# Patient Record
Sex: Female | Born: 1955 | Race: White | Hispanic: No | Marital: Married
Health system: Southern US, Community
[De-identification: ages and names within clinical notes are randomized; demographics above are authoritative.]

## PROBLEM LIST (undated history)

## (undated) DIAGNOSIS — Z8673 Personal history of transient ischemic attack (TIA), and cerebral infarction without residual deficits: Secondary | ICD-10-CM

## (undated) DIAGNOSIS — I10 Essential (primary) hypertension: Secondary | ICD-10-CM

## (undated) DIAGNOSIS — E7800 Pure hypercholesterolemia, unspecified: Secondary | ICD-10-CM

## (undated) DIAGNOSIS — E119 Type 2 diabetes mellitus without complications: Secondary | ICD-10-CM

## (undated) DIAGNOSIS — I2510 Atherosclerotic heart disease of native coronary artery without angina pectoris: Secondary | ICD-10-CM

## (undated) DIAGNOSIS — I471 Supraventricular tachycardia, unspecified: Secondary | ICD-10-CM

## (undated) DIAGNOSIS — N189 Chronic kidney disease, unspecified: Secondary | ICD-10-CM

## (undated) DIAGNOSIS — N186 End stage renal disease: Secondary | ICD-10-CM

## (undated) DIAGNOSIS — Z9889 Other specified postprocedural states: Secondary | ICD-10-CM

## (undated) HISTORY — DX: Personal history of transient ischemic attack (TIA), and cerebral infarction without residual deficits: Z86.73

## (undated) HISTORY — DX: Chronic kidney disease, unspecified: N18.9

## (undated) HISTORY — DX: Atherosclerotic heart disease of native coronary artery without angina pectoris: I25.10

## (undated) HISTORY — DX: Essential (primary) hypertension: I10

## (undated) HISTORY — DX: Supraventricular tachycardia, unspecified: I47.10

## (undated) HISTORY — PX: ABDOMINAL HYSTERECTOMY: SHX81

## (undated) HISTORY — DX: Pure hypercholesterolemia, unspecified: E78.00

## (undated) HISTORY — DX: Other specified postprocedural states: Z98.890

## (undated) HISTORY — DX: Dependence on renal dialysis: N18.6

## (undated) HISTORY — DX: Type 2 diabetes mellitus without complications: E11.9

---

## 2007-06-08 ENCOUNTER — Ambulatory Visit: Payer: Self-pay | Admitting: Family Medicine

## 2007-09-03 ENCOUNTER — Ambulatory Visit: Payer: Self-pay | Admitting: Gastroenterology

## 2007-09-22 ENCOUNTER — Other Ambulatory Visit: Payer: Self-pay

## 2007-09-22 ENCOUNTER — Ambulatory Visit: Payer: Self-pay | Admitting: General Surgery

## 2007-09-25 ENCOUNTER — Ambulatory Visit: Payer: Self-pay | Admitting: General Surgery

## 2007-10-13 ENCOUNTER — Ambulatory Visit: Payer: Self-pay | Admitting: Gastroenterology

## 2008-07-04 ENCOUNTER — Emergency Department (HOSPITAL_COMMUNITY): Admission: EM | Admit: 2008-07-04 | Discharge: 2008-07-04 | Payer: Self-pay | Admitting: Emergency Medicine

## 2009-01-11 ENCOUNTER — Ambulatory Visit: Payer: Self-pay | Admitting: Family Medicine

## 2011-07-15 LAB — URINE CULTURE: Colony Count: >=100000

## 2011-07-15 LAB — DIFFERENTIAL
Basophils Absolute: 0.1
Eosinophils Absolute: 0.1
Eosinophils Relative: 1
Lymphocytes Relative: 17
Lymphs Abs: 2.2
Neutrophils Relative %: 76

## 2011-07-15 LAB — URINALYSIS, ROUTINE W REFLEX MICROSCOPIC
Ketones, ur: 15 — AB
Nitrite: NEGATIVE
Protein, ur: >300 — AB
Urobilinogen, UA: 0.2

## 2011-07-15 LAB — POCT I-STAT, CHEM 8
BUN: 13
Creatinine, Ser: 1.1
Hemoglobin: 12.9
Potassium: 4
Sodium: 136
TCO2: 30

## 2011-07-15 LAB — CBC
Platelets: 326
RDW: 13.3
WBC: 12.5 — ABNORMAL HIGH

## 2014-12-04 ENCOUNTER — Ambulatory Visit: Payer: Self-pay | Admitting: Emergency Medicine

## 2014-12-23 ENCOUNTER — Ambulatory Visit: Payer: Self-pay | Admitting: Orthopedic Surgery

## 2017-01-14 IMAGING — MR MRI CERVICAL SPINE WITHOUT CONTRAST
5 series · 43 of 48 positions shown · non-contrast
Comparison: Lumbar spine radiographs 12/04/2014.

CLINICAL DATA: Severe weakness and numbness in the right upper
extremity. Neck pain extending into the right upper extremity in
hand. The patient was pulled to the ground by a goat while walking
and on a leash 11/30/2014.

EXAM:
MRI CERVICAL SPINE WITHOUT CONTRAST
TECHNIQUE: Multiplanar, multisequence MR imaging of the cervical spine was
performed. No intravenous contrast was administered.

[Series 3: T2 · sagittal · 3.0mm · 0.70mm/px · 8 of 15 slices shown (1 of 2)]
[im 1/15]
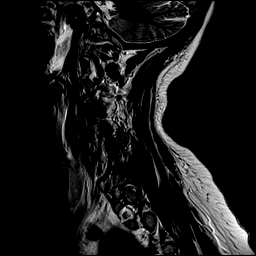
[im 3/15]
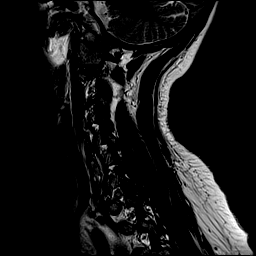
[im 5/15]
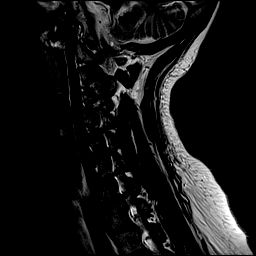
[im 7/15]
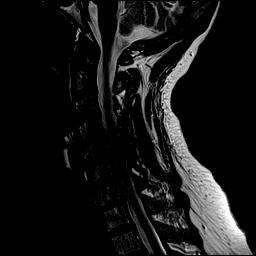
[im 9/15]
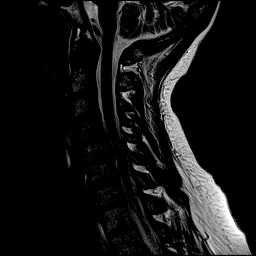
[im 11/15]
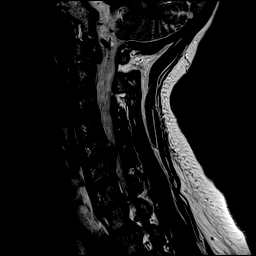
[im 13/15]
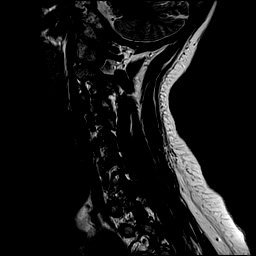
[im 15/15]
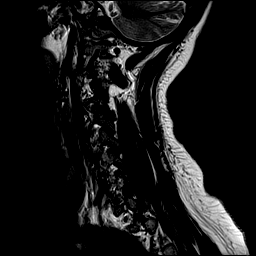

[Series 4: T1 · sagittal · 3.0mm · 0.70mm/px · 7 of 15 slices shown]
[im 1/15]
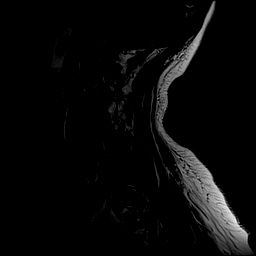
[im 3/15]
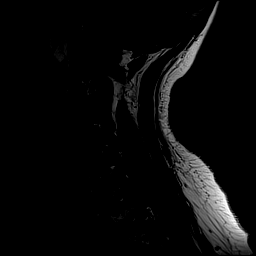
[im 5/15]
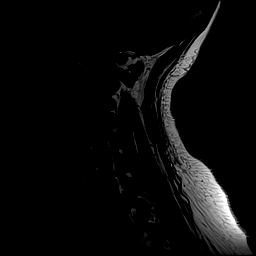
[im 8/15]
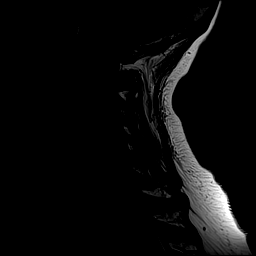
[im 10/15]
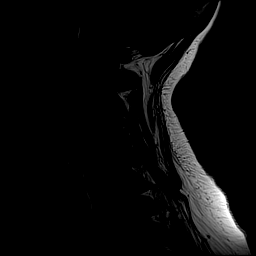
[im 12/15]
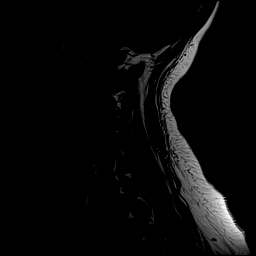
[im 15/15]
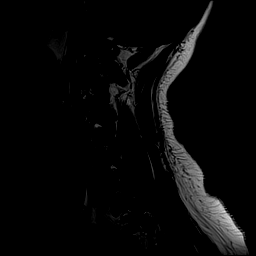

[Series 5: STIR · sagittal · 3.0mm · 0.70mm/px · 7 of 15 slices shown]
[im 1/15]
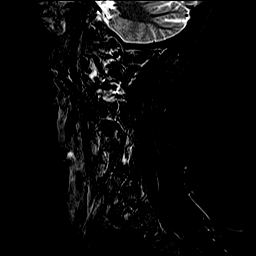
[im 3/15]
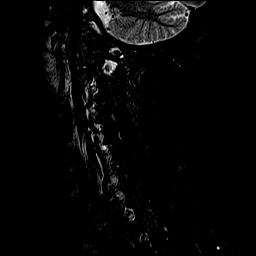
[im 5/15]
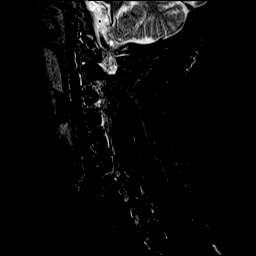
[im 8/15]
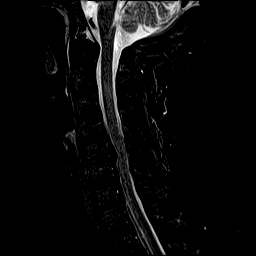
[im 10/15]
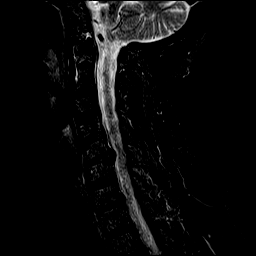
[im 12/15]
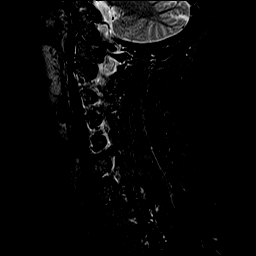
[im 15/15]
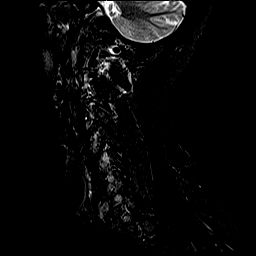

[Series 6: T2 · axial · 3.0mm · 0.70mm/px · z∈[-75,+27]mm · 13 of 28 slices shown (2 of 2)]
[im 1/28]
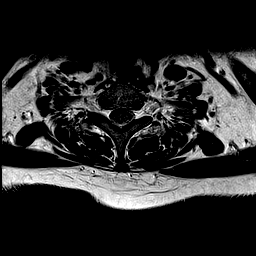
[im 3/28]
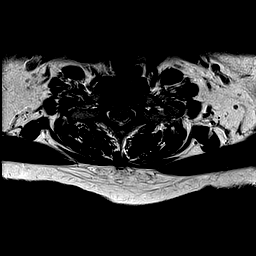
[im 5/28]
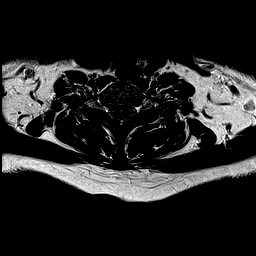
[im 7/28]
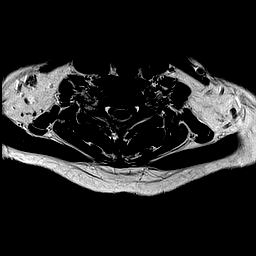
[im 10/28]
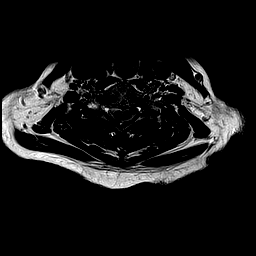
[im 12/28]
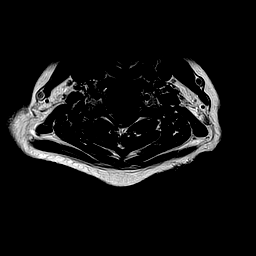
[im 14/28]
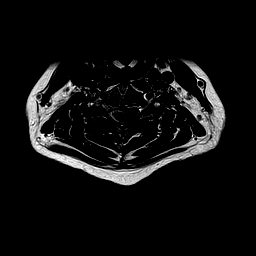
[im 16/28]
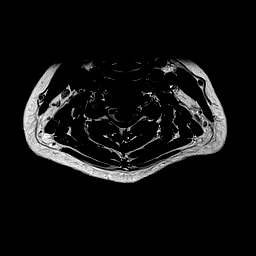
[im 19/28]
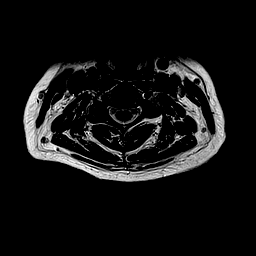
[im 21/28]
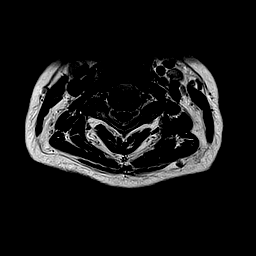
[im 23/28]
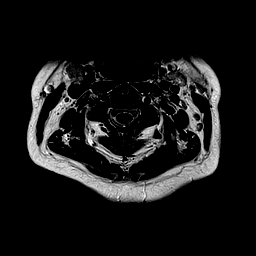
[im 25/28]
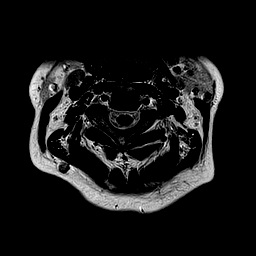
[im 28/28]
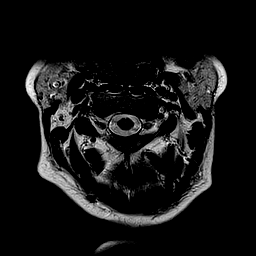

[Series 7: mpgr ax · axial · 3.0mm · 0.35mm/px · z∈[-75,+27]mm · 8 of 28 slices shown]
[im 1/28]
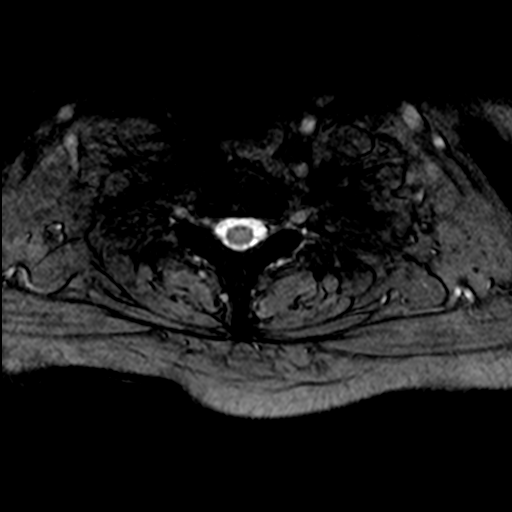
[im 5/28]
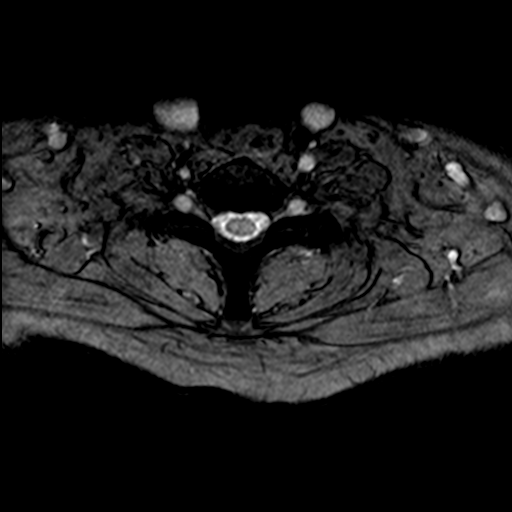
[im 10/28]
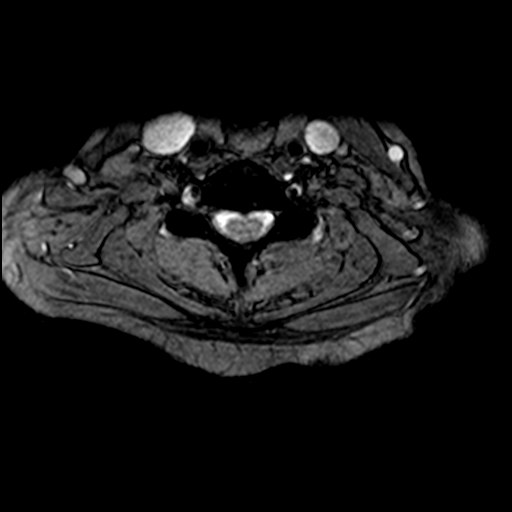
[im 12/28]
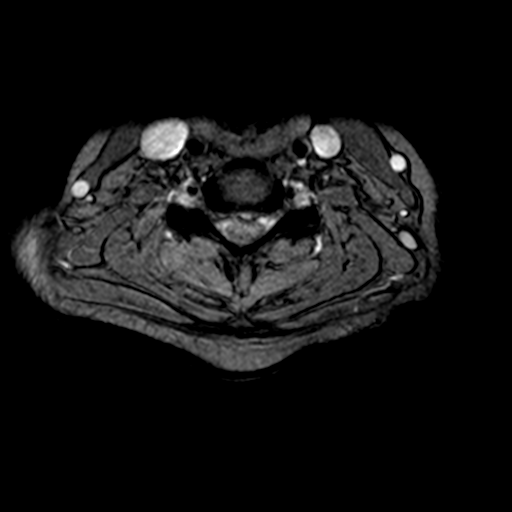
[im 16/28]
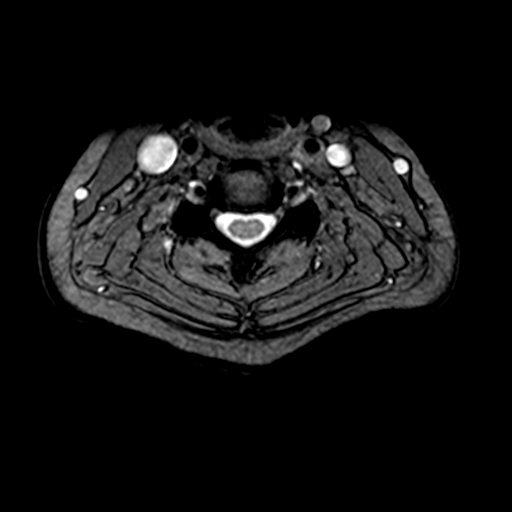
[im 19/28]
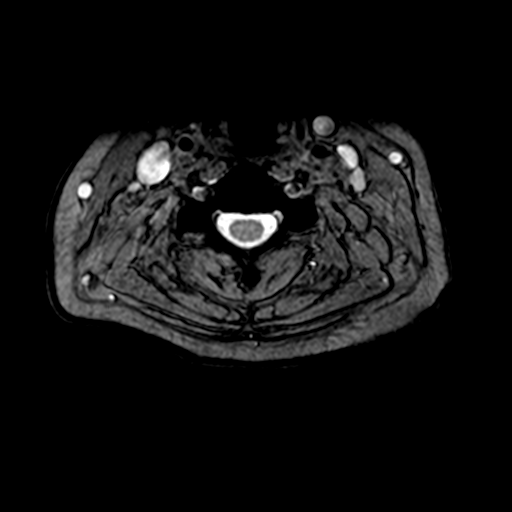
[im 23/28]
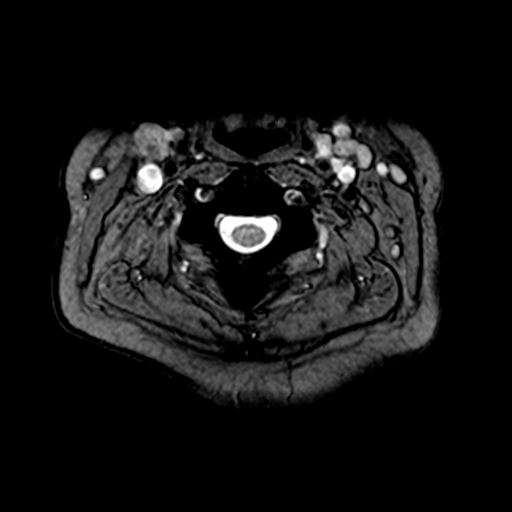
[im 28/28]
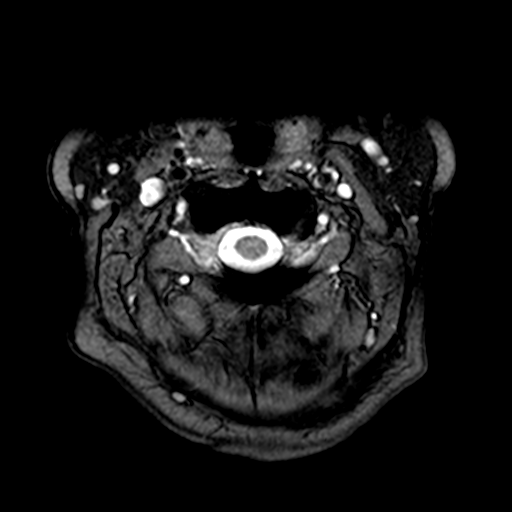

[43 of 48 positions shown; findings below may reference images not displayed]

FINDINGS: Normal signal is present in the cervical and upper thoracic spinal
cord to the lowest imaged level, T2. Marrow signal and vertebral
body heights are normal. Alignment is anatomic.

Craniocervical junction is within normal limits. The visualized
intracranial contents are normal.

Flow is present in the vertebral arteries.

C2-3: Mild facet hypertrophy is present bilaterally without
significant stenosis.

C3-4: Moderate asymmetric right-sided facet hypertrophy is present
with mild right foraminal narrowing.

C4-5: Minimal right-sided uncovertebral spurring is present without
significant stenosis.

C5-6: A broad-based disc osteophyte complex effaces the ventral CSF
and slightly distorts the ventral surface of the cord. The canal is
narrowed to 7.5 mm. Severe right and moderate left foraminal
stenosis is present. There is a significant soft disc component.

C6-7: A central disc protrusion effaces the ventral CSF. Asymmetric
left-sided uncovertebral spurring is present without significant
left foraminal narrowing.

C7-T1: A rightward disc osteophyte complex partially effaces the
ventral CSF. Moderate right foraminal stenosis is evident.

T1-2:  Negative.
IMPRESSION: 1. Mild right foraminal narrowing at C3-4 with potential impact on
the right C4 nerve root.
2. Severe right foraminal stenosis at C5-6 with potential impact on
the right C6 nerve root.
3. Moderate central and left foraminal stenosis at C5-6.
4. Mild central canal stenosis at C6-7 without significant foraminal
narrowing.
5. Moderate right foraminal stenosis at C7-T1.

## 2017-02-03 ENCOUNTER — Other Ambulatory Visit: Payer: Self-pay | Admitting: Family Medicine

## 2017-02-03 DIAGNOSIS — Z1231 Encounter for screening mammogram for malignant neoplasm of breast: Secondary | ICD-10-CM

## 2017-02-13 ENCOUNTER — Ambulatory Visit
Admission: RE | Admit: 2017-02-13 | Discharge: 2017-02-13 | Disposition: A | Discharge status: Home / Self Care | Payer: BC Managed Care – PPO | Source: Ambulatory Visit | Attending: Family Medicine | Admitting: Family Medicine

## 2017-02-13 ENCOUNTER — Encounter (INDEPENDENT_AMBULATORY_CARE_PROVIDER_SITE_OTHER): Payer: Self-pay

## 2017-02-13 DIAGNOSIS — Z1231 Encounter for screening mammogram for malignant neoplasm of breast: Secondary | ICD-10-CM | POA: Diagnosis present

## 2022-04-24 ENCOUNTER — Other Ambulatory Visit: Payer: Self-pay | Admitting: Family Medicine

## 2022-04-24 DIAGNOSIS — Z1231 Encounter for screening mammogram for malignant neoplasm of breast: Secondary | ICD-10-CM

## 2023-03-17 ENCOUNTER — Encounter: Payer: Self-pay | Admitting: Anesthesiology

## 2023-03-31 ENCOUNTER — Ambulatory Visit: Admit: 2023-03-31 | Payer: BC Managed Care – PPO | Admitting: Ophthalmology

## 2023-03-31 SURGERY — PHACOEMULSIFICATION, CATARACT, WITH IOL INSERTION
Anesthesia: Topical | Laterality: Left

## 2023-04-09 ENCOUNTER — Encounter: Payer: Self-pay | Admitting: Ophthalmology

## 2023-04-09 NOTE — Anesthesia Preprocedure Evaluation (Addendum)
Anesthesia Evaluation  Patient identified by MRN, date of birth, ID band Patient awake    Reviewed: Allergy & Precautions, H&P , NPO status , Patient's Chart, lab work & pertinent test results  History of Anesthesia Complications (+) PONV and history of anesthetic complications  Airway Mallampati: II  TM Distance: >3 FB Neck ROM: Full    Dental no notable dental hx. (+) Upper Dentures   Pulmonary neg pulmonary ROS   Pulmonary exam normal breath sounds clear to auscultation       Cardiovascular hypertension, + CAD  negative cardio ROS Normal cardiovascular exam Rhythm:Regular Rate:Normal  Note from NP Dec 2023  PATIENT PROFILE:  Paula Cook is a 67 y.o. 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   CLINICAL SUMMARY There was a previously reported history of AFib, however, patient was seen by EP and diagnosed with SVT in Jan 2002. Underwent SVT/AVNRT ablation Oct 2022 with Dr. Macon Large. Last EP visit Aug 2007, no afib and no need for 2nd ablation.  Initially established with Dr. Lovie Chol August 2017 for evaluation of exertional chest tightness and had an abnormal nuclear stress test. Was pre-hydrated and underwent coronary angiography (06/05/2016) with successful PCI/DES x 2 of prox&distal RCA and DAPT x 6-46mo.     Neuro/Psych negative neurological ROS  negative psych ROS   GI/Hepatic negative GI ROS, Neg liver ROS,,,  Endo/Other  negative endocrine ROSdiabetes    Renal/GU Renal diseasenegative Renal ROS  negative genitourinary   Musculoskeletal negative musculoskeletal ROS (+)    Abdominal   Peds negative pediatric ROS (+)  Hematology negative hematology ROS (+)   Anesthesia Other  Findings PONV (postoperative nausea and vomiting)  Hypertension  Coronary artery disease Diabetes mellitus  ESRD on peritoneal dialysis, stopped exchange yesterday, so didn't have last night SVT (supraventricular tachycardia)  Hx CVA Right carotid stenosis     Reproductive/Obstetrics negative OB ROS                             Anesthesia Physical Anesthesia Plan  ASA: 3  Anesthesia Plan: MAC   Post-op Pain Management:    Induction: Intravenous  PONV Risk Score and Plan:   Airway Management Planned: Natural Airway and Nasal Cannula  Additional Equipment:   Intra-op Plan:   Post-operative Plan:   Informed Consent: I have reviewed the patients History and Physical, chart, labs and discussed the procedure including the risks, benefits and alternatives for the proposed anesthesia with the patient or authorized representative who has indicated his/her understanding and acceptance.     Dental Advisory Given  Plan Discussed with: Anesthesiologist, CRNA and Surgeon  Anesthesia Plan Comments: (Patient consented for risks of anesthesia including but not limited to:  - adverse reactions to medications - damage to eyes, teeth, lips or other oral mucosa - nerve damage due to positioning  - sore throat or hoarseness - Damage to heart, brain, nerves, lungs, other parts of body or loss of life  Patient voiced understanding.)       Anesthesia Quick Evaluation

## 2023-04-14 ENCOUNTER — Ambulatory Visit: Admit: 2023-04-14 | Payer: BC Managed Care – PPO | Admitting: Ophthalmology

## 2023-04-14 SURGERY — PHACOEMULSIFICATION, CATARACT, WITH IOL INSERTION
Anesthesia: Topical | Laterality: Right

## 2023-04-15 NOTE — Discharge Instructions (Signed)

## 2023-04-21 ENCOUNTER — Ambulatory Visit: Payer: Medicare PPO | Admitting: Anesthesiology

## 2023-04-21 ENCOUNTER — Other Ambulatory Visit: Payer: Self-pay

## 2023-04-21 ENCOUNTER — Ambulatory Visit
Admission: RE | Admit: 2023-04-21 | Discharge: 2023-04-21 | Disposition: A | Discharge status: Home / Self Care | Payer: Medicare PPO | Attending: Ophthalmology | Admitting: Ophthalmology

## 2023-04-21 ENCOUNTER — Other Ambulatory Visit
Admission: RE | Admit: 2023-04-21 | Discharge: 2023-04-21 | Disposition: A | Discharge status: Home / Self Care | Payer: Medicare PPO | Attending: Anesthesiology | Admitting: Anesthesiology

## 2023-04-21 ENCOUNTER — Encounter
Admission: RE | Disposition: A | Discharge status: Home / Self Care | Payer: Self-pay | Source: Home / Self Care | Attending: Ophthalmology

## 2023-04-21 DIAGNOSIS — Z8673 Personal history of transient ischemic attack (TIA), and cerebral infarction without residual deficits: Secondary | ICD-10-CM | POA: Insufficient documentation

## 2023-04-21 DIAGNOSIS — E1136 Type 2 diabetes mellitus with diabetic cataract: Secondary | ICD-10-CM | POA: Insufficient documentation

## 2023-04-21 DIAGNOSIS — E7800 Pure hypercholesterolemia, unspecified: Secondary | ICD-10-CM | POA: Insufficient documentation

## 2023-04-21 DIAGNOSIS — Z09 Encounter for follow-up examination after completed treatment for conditions other than malignant neoplasm: Secondary | ICD-10-CM | POA: Diagnosis not present

## 2023-04-21 DIAGNOSIS — H2512 Age-related nuclear cataract, left eye: Secondary | ICD-10-CM | POA: Diagnosis not present

## 2023-04-21 DIAGNOSIS — E113512 Type 2 diabetes mellitus with proliferative diabetic retinopathy with macular edema, left eye: Secondary | ICD-10-CM | POA: Diagnosis not present

## 2023-04-21 DIAGNOSIS — D509 Iron deficiency anemia, unspecified: Secondary | ICD-10-CM | POA: Insufficient documentation

## 2023-04-21 DIAGNOSIS — I120 Hypertensive chronic kidney disease with stage 5 chronic kidney disease or end stage renal disease: Secondary | ICD-10-CM | POA: Diagnosis not present

## 2023-04-21 DIAGNOSIS — Z794 Long term (current) use of insulin: Secondary | ICD-10-CM | POA: Insufficient documentation

## 2023-04-21 DIAGNOSIS — Z992 Dependence on renal dialysis: Secondary | ICD-10-CM | POA: Diagnosis not present

## 2023-04-21 DIAGNOSIS — N186 End stage renal disease: Secondary | ICD-10-CM | POA: Insufficient documentation

## 2023-04-21 DIAGNOSIS — E1122 Type 2 diabetes mellitus with diabetic chronic kidney disease: Secondary | ICD-10-CM | POA: Diagnosis not present

## 2023-04-21 DIAGNOSIS — I2510 Atherosclerotic heart disease of native coronary artery without angina pectoris: Secondary | ICD-10-CM | POA: Diagnosis not present

## 2023-04-21 DIAGNOSIS — Z01812 Encounter for preprocedural laboratory examination: Secondary | ICD-10-CM

## 2023-04-21 HISTORY — PX: CATARACT EXTRACTION W/PHACO: SHX586

## 2023-04-21 LAB — GLUCOSE, CAPILLARY: Glucose-Capillary: 129 mg/dL — ABNORMAL HIGH (ref 70–99)

## 2023-04-21 LAB — BASIC METABOLIC PANEL
Anion gap: 12 (ref 5–15)
BUN: 57 mg/dL — ABNORMAL HIGH (ref 8–23)
CO2: 23 mmol/L (ref 22–32)
Calcium: 8.9 mg/dL (ref 8.9–10.3)
Chloride: 100 mmol/L (ref 98–111)
Creatinine, Ser: 6.48 mg/dL — ABNORMAL HIGH (ref 0.44–1.00)
GFR, Estimated: 7 mL/min — ABNORMAL LOW (ref >60)
Glucose, Bld: 146 mg/dL — ABNORMAL HIGH (ref 70–99)
Potassium: 4.6 mmol/L (ref 3.5–5.1)
Sodium: 135 mmol/L (ref 135–145)

## 2023-04-21 SURGERY — PHACOEMULSIFICATION, CATARACT, WITH IOL INSERTION
Anesthesia: Monitor Anesthesia Care | Site: Eye | Laterality: Left

## 2023-04-21 MED ORDER — MOXIFLOXACIN HCL 0.5 % OP SOLN
OPHTHALMIC | Status: DC | PRN
  Administered 2023-04-21: .2 mL via OPHTHALMIC

## 2023-04-21 MED ORDER — LACTATED RINGERS IV SOLN: INTRAVENOUS | Status: DC

## 2023-04-21 MED ORDER — SIGHTPATH DOSE#1 NA HYALUR & NA CHOND-NA HYALUR IO KIT
PACK | INTRAOCULAR | Status: DC | PRN
  Administered 2023-04-21: 1 via OPHTHALMIC

## 2023-04-21 MED ORDER — MIDAZOLAM HCL 2 MG/2ML IJ SOLN
INTRAMUSCULAR | Status: DC | PRN
  Administered 2023-04-21: 2 mg via INTRAVENOUS

## 2023-04-21 MED ORDER — TETRACAINE HCL 0.5 % OP SOLN
1.0000 [drp] | OPHTHALMIC | Status: DC | PRN
  Administered 2023-04-21 (×3): 1 [drp] via OPHTHALMIC

## 2023-04-21 MED ORDER — SIGHTPATH DOSE#1 BSS IO SOLN
INTRAOCULAR | Status: DC | PRN
  Administered 2023-04-21: 15 mL

## 2023-04-21 MED ORDER — ARMC OPHTHALMIC DILATING DROPS
1.0000 | OPHTHALMIC | Status: DC | PRN
  Administered 2023-04-21 (×3): 1 via OPHTHALMIC

## 2023-04-21 MED ORDER — TRIAMCINOLONE ACETONIDE 40 MG/ML IJ SUSP
INTRAMUSCULAR | Status: DC | PRN
  Administered 2023-04-21: .1 mL

## 2023-04-21 MED ORDER — DIPHENHYDRAMINE HCL 50 MG/ML IJ SOLN
INTRAMUSCULAR | Status: DC | PRN
  Administered 2023-04-21: 6.25 mg via INTRAVENOUS

## 2023-04-21 MED ORDER — FENTANYL CITRATE (PF) 100 MCG/2ML IJ SOLN
INTRAMUSCULAR | Status: DC | PRN
  Administered 2023-04-21 (×2): 50 ug via INTRAVENOUS

## 2023-04-21 MED ORDER — LIDOCAINE HCL (PF) 2 % IJ SOLN
INTRAOCULAR | Status: DC | PRN
  Administered 2023-04-21: 1 mL via INTRAOCULAR

## 2023-04-21 MED ORDER — SIGHTPATH DOSE#1 BSS IO SOLN
INTRAOCULAR | Status: DC | PRN
  Administered 2023-04-21: 69 mL via OPHTHALMIC

## 2023-04-21 SURGICAL SUPPLY — 11 items
CATARACT SUITE SIGHTPATH (MISCELLANEOUS) ×1 IMPLANT
DISSECTOR HYDRO NUCLEUS 50X22 (MISCELLANEOUS) ×1 IMPLANT
FEE CATARACT SUITE SIGHTPATH (MISCELLANEOUS) ×1 IMPLANT
GLOVE SURG GAMMEX PI TX LF 7.5 (GLOVE) ×1 IMPLANT
GLOVE SURG SYN 8.5 E (GLOVE) ×1 IMPLANT
GLOVE SURG SYN 8.5 PF PI (GLOVE) ×1 IMPLANT
LENS IOL TECNIS EYHANCE 24.5 (Intraocular Lens) IMPLANT
NDL FILTER BLUNT 18X1 1/2 (NEEDLE) ×1 IMPLANT
NEEDLE FILTER BLUNT 18X1 1/2 (NEEDLE) ×1 IMPLANT
SYR 3ML LL SCALE MARK (SYRINGE) ×1 IMPLANT
SYR 5ML LL (SYRINGE) ×1 IMPLANT

## 2023-04-21 NOTE — H&P (Signed)
[facility]   Primary Care Physician:  Dione Housekeeper, MD Ophthalmologist: Dr. Willey Blade  Pre-Procedure History & Physical: HPI:  Paula Cook is a 67 y.o. female here for cataract surgery.   Past Medical History:  Diagnosis Date   Chronic kidney disease    Coronary artery disease    Diabetes mellitus without complication (HCC)    ESRD on peritoneal dialysis (HCC)    History of CVA in adulthood    Hypercholesteremia    Hypertension    PONV (postoperative nausea and vomiting)    SVT (supraventricular tachycardia)     Past Surgical History:  Procedure Laterality Date   ABDOMINAL HYSTERECTOMY      Prior to Admission medications   Medication Sig Start Date End Date Taking? Authorizing Provider  acetaminophen (TYLENOL) 500 MG tablet Take 500 mg by mouth every 6 (six) hours as needed.   Yes [provider]  aspirin EC 81 MG tablet Take 81 mg by mouth daily. Swallow whole.   Yes [provider]  carvedilol (COREG) 12.5 MG tablet Take 12.5 mg by mouth 2 (two) times daily with a meal.   Yes [provider]  Cinnamon 500 MG TABS Take 2 tablets by mouth.   Yes [provider]  docusate (COLACE) 50 MG/5ML liquid Take 50 mg by mouth daily.   Yes [provider]  ergocalciferol (VITAMIN D2) 1.25 MG (50000 UT) capsule Take 50,000 Units by mouth once a week.   Yes [provider]  Evolocumab (REPATHA [place]) Inject into the skin every 14 (fourteen) days. Last dose 04/06/23   Yes [provider]  insulin aspart (NOVOLOG) 100 UNIT/ML injection Inject 10 Units into the skin 3 (three) times daily with meals. Sliding scale   Yes [provider]  insulin glargine (LANTUS) 100 UNIT/ML injection Inject 8 Units into the skin at bedtime.   Yes [provider]  loratadine (CLARITIN) 10 MG tablet Take 10 mg by mouth daily.   Yes [provider]  sevelamer carbonate (RENVELA) 800 MG tablet Take 800  mg by mouth 2 (two) times daily.   Yes [provider]    Allergies as of 04/07/2023   (Not on File)    Family History  Problem Relation Age of Onset   Breast cancer Mother 32   Breast cancer Maternal Aunt    Breast cancer Maternal Grandmother     Social History   Socioeconomic History   Marital status: Married    Spouse name: Not on file   Number of children: Not on file   Years of education: Not on file   Highest education level: Not on file  Occupational History   Not on file  Tobacco Use   Smoking status: Not on file   Smokeless tobacco: Not on file  Substance and Sexual Activity   Alcohol use: Not on file   Drug use: Not on file   Sexual activity: Not on file  Other Topics Concern   Not on file  Social History Narrative   Not on file   Social Determinants of Health   Financial Resource Strain: Not on file  Food Insecurity: Not on file  Transportation Needs: Not on file  Physical Activity: Not on file  Stress: Not on file  Social Connections: Not on file  Intimate Partner Violence: Not on file    Review of Systems: See HPI, otherwise negative ROS  Physical Exam: Ht 5\' 5"  (1.651 m)   Wt  89.8 kg   BMI 32.95 kg/m  General:   Alert, cooperative in NAD Head:  Normocephalic and atraumatic. Respiratory:  Normal work of breathing. Cardiovascular:  RRR  Impression/Plan: Paula Cook is here for cataract surgery.  Risks, benefits, limitations, and alternatives regarding cataract surgery have been reviewed with the patient.  Questions have been answered.  All parties agreeable.   Willey Blade, MD  04/21/2023, 10:09 AM

## 2023-04-21 NOTE — Anesthesia Postprocedure Evaluation (Signed)
Anesthesia Post Note  Patient: Erendida Scire  Procedure(s) Performed: CATARACT EXTRACTION PHACO AND INTRAOCULAR LENS PLACEMENT (IOC) LEFT DIABETIC INTRAVITREAL KENALOG INJECTION  3.50  00:30.7 (Left: Eye)  Patient location during evaluation: PACU Anesthesia Type: MAC Level of consciousness: awake and alert Pain management: pain level controlled Vital Signs Assessment: post-procedure vital signs reviewed and stable Respiratory status: spontaneous breathing, nonlabored ventilation, respiratory function stable and patient connected to nasal cannula oxygen Cardiovascular status: stable and blood pressure returned to baseline Postop Assessment: no apparent nausea or vomiting Anesthetic complications: no   No notable events documented.   Last Vitals:  Vitals:   04/21/23 1040 04/21/23 1045  BP: 116/61 114/72  Pulse: 71 66  Resp: 14 14  Temp: (!) 36.2 C (!) 36.2 C  SpO2: 93% 94%    Last Pain:  Vitals:   04/21/23 1045  PainSc: 0-No pain                 Ketsia Linebaugh C Siobhan Zaro

## 2023-04-21 NOTE — Op Note (Signed)
OPERATIVE NOTE  Paula Cook [id number] 04/21/2023   PREOPERATIVE DIAGNOSIS:    Nuclear sclerotic cataract left eye.  H25.12 Diabetic retinopathy with macular edema E11.3512   POSTOPERATIVE DIAGNOSIS:    same   PROCEDURE:    Phacoemusification with posterior chamber intraocular lens placement of the left eye  CPT [phone] Intravitreal injection of kenalog, left eye.  CPT [id number]  LENS:   Implant Name Type Inv. Item Serial No. Manufacturer Lot No. LRB No. Used Action  LENS IOL TECNIS EYHANCE 24.5 - [id number] Intraocular Lens LENS IOL TECNIS EYHANCE 24.5 [id number] [employer]  Left 1 Implanted      Procedure(s): CATARACT EXTRACTION PHACO AND INTRAOCULAR LENS PLACEMENT (IOC) LEFT DIABETIC INTRAVITREAL KENALOG INJECTION  3.50  00:30.7 (Left)  DIB00 +24.5   ULTRASOUND TIME: 0 minutes 30 seconds.  CDE 3.50   SURGEON:  Willey Blade, MD, MPH   ANESTHESIA:  Topical with tetracaine drops augmented with 1% preservative-free intracameral lidocaine.  ESTIMATED BLOOD LOSS: <1 mL   COMPLICATIONS:  None.   DESCRIPTION OF PROCEDURE:  The patient was identified in the holding room and transported to the operating room and placed in the supine position under the operating microscope.  The left eye was identified as the operative eye and it was prepped and draped in the usual sterile ophthalmic fashion.   A 1.0 millimeter clear-corneal paracentesis was made at the 5:00 position. 0.5 ml of preservative-free 1% lidocaine with epinephrine was injected into the anterior chamber.  The anterior chamber was filled with viscoelastic.  A 2.4 millimeter keratome was used to make a near-clear corneal incision at the 2:00 position.  A curvilinear capsulorrhexis was made with a cystotome and capsulorrhexis forceps.  Balanced salt solution was used to hydrodissect and hydrodelineate the nucleus.   Phacoemulsification was then used in stop and chop fashion to remove the lens nucleus and epinucleus.  The  remaining cortex was then removed using the irrigation and aspiration handpiece. Viscoelastic was then placed into the capsular bag to distend it for lens placement.  A lens was then injected into the capsular bag.  The remaining viscoelastic was aspirated.   Wounds were hydrated with balanced salt solution.  The anterior chamber was inflated to a physiologic pressure with balanced salt solution.  Intracameral vigamox 0.1 mL undiltued was injected into the eye and a drop placed onto the ocular surface.  Calipers were used to mark 3.5 mm posterior to the limbus in the inferotemporal quadrant.  0.1 mL of Kenalog 40 mg/mL was injected into the vitreous cavity.   No wound leaks were noted.  The patient was taken to the recovery room in stable condition without complications of anesthesia or surgery  Willey Blade 04/21/2023, 10:37 AM

## 2023-04-21 NOTE — Transfer of Care (Signed)
Immediate Anesthesia Transfer of Care Note  Patient: Paula Cook  Procedure(s) Performed: CATARACT EXTRACTION PHACO AND INTRAOCULAR LENS PLACEMENT (IOC) LEFT DIABETIC INTRAVITREAL KENALOG INJECTION  3.50  00:30.7 (Left: Eye)  Patient Location: PACU  Anesthesia Type:MAC  Level of Consciousness: awake and alert   Airway & Oxygen Therapy: Patient Spontanous Breathing and Patient connected to nasal cannula oxygen  Post-op Assessment: Report given to RN and Post -op Vital signs reviewed and stable  Post vital signs: Reviewed and stable  Last Vitals:  Vitals Value Taken Time  BP 116/61 04/21/23 1040  Temp 36.2 C 04/21/23 1040  Pulse 65 04/21/23 1042  Resp 9 04/21/23 1042  SpO2 92 % 04/21/23 1042  Vitals shown include unvalidated device data.  Last Pain:  Vitals:   04/21/23 1040  PainSc: 0-No pain         Complications: No notable events documented.

## 2023-04-22 ENCOUNTER — Encounter: Payer: Self-pay | Admitting: Ophthalmology

## 2023-05-02 NOTE — Discharge Instructions (Signed)

## 2023-05-05 ENCOUNTER — Ambulatory Visit
Admission: RE | Admit: 2023-05-05 | Discharge: 2023-05-05 | Disposition: A | Discharge status: Home / Self Care | Payer: Medicare PPO | Attending: Ophthalmology | Admitting: Ophthalmology

## 2023-05-05 ENCOUNTER — Ambulatory Visit: Payer: Medicare PPO | Admitting: Anesthesiology

## 2023-05-05 ENCOUNTER — Other Ambulatory Visit: Payer: Self-pay

## 2023-05-05 ENCOUNTER — Encounter
Admission: RE | Disposition: A | Discharge status: Home / Self Care | Payer: Self-pay | Source: Home / Self Care | Attending: Ophthalmology

## 2023-05-05 DIAGNOSIS — E119 Type 2 diabetes mellitus without complications: Secondary | ICD-10-CM

## 2023-05-05 DIAGNOSIS — E1122 Type 2 diabetes mellitus with diabetic chronic kidney disease: Secondary | ICD-10-CM | POA: Diagnosis not present

## 2023-05-05 DIAGNOSIS — I2510 Atherosclerotic heart disease of native coronary artery without angina pectoris: Secondary | ICD-10-CM | POA: Insufficient documentation

## 2023-05-05 DIAGNOSIS — Z794 Long term (current) use of insulin: Secondary | ICD-10-CM | POA: Insufficient documentation

## 2023-05-05 DIAGNOSIS — Z6833 Body mass index (BMI) 33.0-33.9, adult: Secondary | ICD-10-CM | POA: Diagnosis not present

## 2023-05-05 DIAGNOSIS — N186 End stage renal disease: Secondary | ICD-10-CM | POA: Diagnosis not present

## 2023-05-05 DIAGNOSIS — Z8673 Personal history of transient ischemic attack (TIA), and cerebral infarction without residual deficits: Secondary | ICD-10-CM | POA: Insufficient documentation

## 2023-05-05 DIAGNOSIS — E669 Obesity, unspecified: Secondary | ICD-10-CM | POA: Insufficient documentation

## 2023-05-05 DIAGNOSIS — E11311 Type 2 diabetes mellitus with unspecified diabetic retinopathy with macular edema: Secondary | ICD-10-CM | POA: Insufficient documentation

## 2023-05-05 DIAGNOSIS — I120 Hypertensive chronic kidney disease with stage 5 chronic kidney disease or end stage renal disease: Secondary | ICD-10-CM | POA: Diagnosis not present

## 2023-05-05 DIAGNOSIS — Z992 Dependence on renal dialysis: Secondary | ICD-10-CM | POA: Diagnosis not present

## 2023-05-05 DIAGNOSIS — E1136 Type 2 diabetes mellitus with diabetic cataract: Secondary | ICD-10-CM | POA: Diagnosis present

## 2023-05-05 HISTORY — PX: CATARACT EXTRACTION W/PHACO: SHX586

## 2023-05-05 LAB — GLUCOSE, CAPILLARY: Glucose-Capillary: 163 mg/dL — ABNORMAL HIGH (ref 70–99)

## 2023-05-05 SURGERY — PHACOEMULSIFICATION, CATARACT, WITH IOL INSERTION
Anesthesia: Monitor Anesthesia Care | Laterality: Right

## 2023-05-05 MED ORDER — ACETAMINOPHEN 160 MG/5ML PO SOLN: 325.0000 mg | ORAL | Status: DC | PRN

## 2023-05-05 MED ORDER — TRIAMCINOLONE ACETONIDE 40 MG/ML IJ SUSP
INTRAMUSCULAR | Status: DC | PRN
  Administered 2023-05-05: .2 mL via INTRAMUSCULAR

## 2023-05-05 MED ORDER — LIDOCAINE HCL (PF) 2 % IJ SOLN
INTRAOCULAR | Status: DC | PRN
  Administered 2023-05-05: 1 mL via INTRAOCULAR

## 2023-05-05 MED ORDER — SIGHTPATH DOSE#1 BSS IO SOLN
INTRAOCULAR | Status: DC | PRN
  Administered 2023-05-05: 15 mL via INTRAOCULAR

## 2023-05-05 MED ORDER — LACTATED RINGERS IV SOLN: INTRAVENOUS | Status: DC

## 2023-05-05 MED ORDER — ONDANSETRON HCL 4 MG/2ML IJ SOLN: 4.0000 mg | Freq: Once | INTRAMUSCULAR | Status: DC | PRN

## 2023-05-05 MED ORDER — MIDAZOLAM HCL 2 MG/2ML IJ SOLN
INTRAMUSCULAR | Status: DC | PRN
  Administered 2023-05-05: .5 mg via INTRAVENOUS
  Administered 2023-05-05: 1.5 mg via INTRAVENOUS

## 2023-05-05 MED ORDER — ACETAMINOPHEN 325 MG PO TABS: 650.0000 mg | ORAL_TABLET | Freq: Once | ORAL | Status: DC | PRN

## 2023-05-05 MED ORDER — MOXIFLOXACIN HCL 0.5 % OP SOLN
OPHTHALMIC | Status: DC | PRN
  Administered 2023-05-05: .2 mL via OPHTHALMIC

## 2023-05-05 MED ORDER — FENTANYL CITRATE (PF) 100 MCG/2ML IJ SOLN
INTRAMUSCULAR | Status: DC | PRN
  Administered 2023-05-05: 25 ug via INTRAVENOUS

## 2023-05-05 MED ORDER — SIGHTPATH DOSE#1 NA HYALUR & NA CHOND-NA HYALUR IO KIT
PACK | INTRAOCULAR | Status: DC | PRN
  Administered 2023-05-05: 1 via OPHTHALMIC

## 2023-05-05 MED ORDER — TETRACAINE HCL 0.5 % OP SOLN
1.0000 [drp] | OPHTHALMIC | Status: DC | PRN
  Administered 2023-05-05 (×3): 1 [drp] via OPHTHALMIC

## 2023-05-05 MED ORDER — SIGHTPATH DOSE#1 BSS IO SOLN
INTRAOCULAR | Status: DC | PRN
  Administered 2023-05-05: 96 mL via OPHTHALMIC

## 2023-05-05 MED ORDER — ARMC OPHTHALMIC DILATING DROPS
1.0000 | OPHTHALMIC | Status: DC | PRN
  Administered 2023-05-05 (×3): 1 via OPHTHALMIC

## 2023-05-05 MED ORDER — SODIUM CHLORIDE 0.9% FLUSH
INTRAVENOUS | Status: DC | PRN
  Administered 2023-05-05: 10 mL via INTRAVENOUS

## 2023-05-05 SURGICAL SUPPLY — 12 items
CATARACT SUITE SIGHTPATH (MISCELLANEOUS) ×1 IMPLANT
DISSECTOR HYDRO NUCLEUS 50X22 (MISCELLANEOUS) ×1 IMPLANT
FEE CATARACT SUITE SIGHTPATH (MISCELLANEOUS) ×1 IMPLANT
GLOVE SURG GAMMEX PI TX LF 7.5 (GLOVE) ×1 IMPLANT
GLOVE SURG SYN 8.5 E (GLOVE) ×1 IMPLANT
GLOVE SURG SYN 8.5 PF PI (GLOVE) ×1 IMPLANT
LENS IOL TECNIS EYHANCE 24.0 (Intraocular Lens) IMPLANT
NDL FILTER BLUNT 18X1 1/2 (NEEDLE) ×1 IMPLANT
NEEDLE FILTER BLUNT 18X1 1/2 (NEEDLE) ×1 IMPLANT
SYR 3ML LL SCALE MARK (SYRINGE) ×1 IMPLANT
SYR 5ML LL (SYRINGE) ×1 IMPLANT
TIP ITREPID SGL USE BENT I/A (SUCTIONS) IMPLANT

## 2023-05-05 NOTE — H&P (Signed)
[facility]   Primary Care Physician:  Dione Housekeeper, MD Ophthalmologist: Dr. Willey Blade  Pre-Procedure History & Physical: HPI:  Suleima Pranavi Aure is a 67 y.o. female here for cataract surgery.   Past Medical History:  Diagnosis Date   Chronic kidney disease    Coronary artery disease    Diabetes mellitus without complication (HCC)    ESRD on peritoneal dialysis (HCC)    History of CVA in adulthood    Hypercholesteremia    Hypertension    PONV (postoperative nausea and vomiting)    SVT (supraventricular tachycardia)     Past Surgical History:  Procedure Laterality Date   ABDOMINAL HYSTERECTOMY     CATARACT EXTRACTION W/PHACO Left 04/21/2023   Procedure: CATARACT EXTRACTION PHACO AND INTRAOCULAR LENS PLACEMENT (IOC) LEFT DIABETIC INTRAVITREAL KENALOG INJECTION  3.50  00:30.7;  Surgeon: Nevada Crane, MD;  Location: [facility];  Service: Ophthalmology;  Laterality: Left;    Prior to Admission medications   Medication Sig Start Date End Date Taking? Authorizing Provider  acetaminophen (TYLENOL) 500 MG tablet Take 500 mg by mouth every 6 (six) hours as needed.   Yes [provider]  aspirin EC 81 MG tablet Take 81 mg by mouth daily. Swallow whole.   Yes [provider]  carvedilol (COREG) 12.5 MG tablet Take 12.5 mg by mouth 2 (two) times daily with a meal.   Yes [provider]  Cinnamon 500 MG TABS Take 2 tablets by mouth.   Yes [provider]  docusate (COLACE) 50 MG/5ML liquid Take 50 mg by mouth daily.   Yes [provider]  ergocalciferol (VITAMIN D2) 1.25 MG (50000 UT) capsule Take 50,000 Units by mouth once a week.   Yes [provider]  Evolocumab (REPATHA [place]) Inject into the skin every 14 (fourteen) days. Last dose 04/06/23   Yes [provider]  insulin aspart (NOVOLOG) 100 UNIT/ML injection Inject 10 Units into the skin 3 (three) times daily with meals. Sliding scale   Yes  [provider]  insulin glargine (LANTUS) 100 UNIT/ML injection Inject 8 Units into the skin at bedtime.   Yes [provider]  loratadine (CLARITIN) 10 MG tablet Take 10 mg by mouth daily.   Yes [provider]  sevelamer carbonate (RENVELA) 800 MG tablet Take 800 mg by mouth 2 (two) times daily.   Yes [provider]    Allergies as of 04/07/2023   (Not on File)    Family History  Problem Relation Age of Onset   Breast cancer Mother 52   Breast cancer Maternal Aunt    Breast cancer Maternal Grandmother     Social History   Socioeconomic History   Marital status: Married    Spouse name: Not on file   Number of children: Not on file   Years of education: Not on file   Highest education level: Not on file  Occupational History   Not on file  Tobacco Use   Smoking status: Not on file   Smokeless tobacco: Not on file  Substance and Sexual Activity   Alcohol use: Not on file   Drug use: Not on file   Sexual activity: Not on file  Other Topics Concern   Not on file  Social History Narrative   Not on file   Social Determinants of Health   Financial Resource Strain: Not on file  Food Insecurity: Not on file  Transportation Needs: Not on file  Physical  Activity: Not on file  Stress: Not on file  Social Connections: Not on file  Intimate Partner Violence: Not on file    Review of Systems: See HPI, otherwise negative ROS  Physical Exam: BP 119/62   Pulse 76   Temp (!) 97.3 F (36.3 C) (Temporal)   Resp 14   Wt 92.1 kg   SpO2 98%   BMI 33.78 kg/m  General:   Alert, cooperative in NAD Head:  Normocephalic and atraumatic. Respiratory:  Normal work of breathing. Cardiovascular:  RRR  Impression/Plan: Dulcemaria Wilhelmenia Addis is here for cataract surgery.  Risks, benefits, limitations, and alternatives regarding cataract surgery have been reviewed with the patient.  Questions have been answered.  All parties  agreeable.   Willey Blade, MD  05/05/2023, 10:16 AM

## 2023-05-05 NOTE — Op Note (Signed)
OPERATIVE NOTE  Paula Cook [id number] 05/05/2023   PREOPERATIVE DIAGNOSIS:    Nuclear sclerotic cataract right eye.  H25.11 M57.8469 Diabetic retinopathy with macular edema, right eye.   POSTOPERATIVE DIAGNOSIS:   same    PROCEDURE:    Phacoemusification with posterior chamber intraocular lens placement of the right eye  CPT [phone] intravitreal injection of kenalog, right eye.  LENS:   Implant Name Type Inv. Item Serial No. Manufacturer Lot No. LRB No. Used Action  LENS IOL TECNIS EYHANCE 24.0 - [id number] Intraocular Lens LENS IOL TECNIS EYHANCE 24.0 [id number] [employer]  Right 1 Implanted       Procedure(s): CATARACT EXTRACTION PHACO AND INTRAOCULAR LENS PLACEMENT (IOC) RIGHT DIABETIC INTRAVITREAL KENALOG INJECTION 3.16 00:31.4 (Right)  DIB00 +24.0   ULTRASOUND TIME: 0 minutes 31 seconds.  CDE 3.16   SURGEON:  Willey Blade, MD, MPH  ANESTHESIOLOGIST: Anesthesiologist: Reed Breech, MD CRNA: Genia Del, CRNA   ANESTHESIA:  Topical with tetracaine drops augmented with 1% preservative-free intracameral lidocaine.  ESTIMATED BLOOD LOSS: less than 1 mL.   COMPLICATIONS:  None.   DESCRIPTION OF PROCEDURE:  The patient was identified in the holding room and transported to the operating room and placed in the supine position under the operating microscope.  The right eye was identified as the operative eye and it was prepped and draped in the usual sterile ophthalmic fashion.   A 1.0 millimeter clear-corneal paracentesis was made at the 10:30 position. 0.5 ml of preservative-free 1% lidocaine with epinephrine was injected into the anterior chamber.  The anterior chamber was filled with viscoelastic.  A 2.4 millimeter keratome was used to make a near-clear corneal incision at the 8:00 position.  A curvilinear capsulorrhexis was made with a cystotome and capsulorrhexis forceps.  Balanced salt solution was used to hydrodissect and hydrodelineate the nucleus.    Phacoemulsification was then used in stop and chop fashion to remove the lens nucleus and epinucleus.  The remaining cortex was then removed using the irrigation and aspiration handpiece. Viscoelastic was then placed into the capsular bag to distend it for lens placement.  A lens was then injected into the capsular bag.  The remaining viscoelastic was aspirated.   Wounds were hydrated with balanced salt solution.  The anterior chamber was inflated to a physiologic pressure with balanced salt solution.   Intracameral vigamox 0.1 mL undiluted was injected into the eye and a drop placed onto the ocular surface.  Calipers were used to mark 3.5 mm posterior to the limbus in the inferotemporal quadrant.  0.1 mL of Kenalog 40 mg/mL was injected into the vitreous cavity.   No wound leaks were noted.  The patient was taken to the recovery room in stable condition without complications of anesthesia or surgery  Willey Blade 05/05/2023, 10:47 AM

## 2023-05-05 NOTE — Transfer of Care (Signed)
Immediate Anesthesia Transfer of Care Note  Patient: Paula Cook  Procedure(s) Performed: CATARACT EXTRACTION PHACO AND INTRAOCULAR LENS PLACEMENT (IOC) RIGHT DIABETIC INTRAVITREAL KENALOG INJECTION 3.16 00:31.4 (Right)  Patient Location: PACU  Anesthesia Type:MAC  Level of Consciousness: awake, alert , and oriented  Airway & Oxygen Therapy: Patient Spontanous Breathing  Post-op Assessment: Report given to RN and Post -op Vital signs reviewed and stable  Post vital signs: Reviewed and stable  Last Vitals: See PACU flow sheet for temp Vitals Value Taken Time  BP 141/64 05/05/23 1048  Temp    Pulse 72 05/05/23 1050  Resp 10 05/05/23 1050  SpO2 100 % 05/05/23 1050  Vitals shown include unfiled device data.  Last Pain:  Vitals:   05/05/23 0915  TempSrc: Temporal  PainSc: 0-No pain         Complications: No notable events documented.

## 2023-05-05 NOTE — Anesthesia Postprocedure Evaluation (Signed)
Anesthesia Post Note  Patient: Paula Cook  Procedure(s) Performed: CATARACT EXTRACTION PHACO AND INTRAOCULAR LENS PLACEMENT (IOC) RIGHT DIABETIC INTRAVITREAL KENALOG INJECTION 3.16 00:31.4 (Right)  Patient location during evaluation: PACU Anesthesia Type: MAC Level of consciousness: awake and alert, oriented and patient cooperative Pain management: pain level controlled Vital Signs Assessment: post-procedure vital signs reviewed and stable Respiratory status: spontaneous breathing, nonlabored ventilation and respiratory function stable Cardiovascular status: blood pressure returned to baseline and stable Postop Assessment: adequate PO intake Anesthetic complications: no   No notable events documented.   Last Vitals:  Vitals:   05/05/23 1048 05/05/23 1053  BP: (!) 141/62 (!) 125/55  Pulse: 78 72  Resp: 17 14  Temp: (!) 36.2 C (!) 36.2 C  SpO2: 97% 100%    Last Pain:  Vitals:   05/05/23 1053  TempSrc:   PainSc: 0-No pain                 Reed Breech

## 2023-05-05 NOTE — Anesthesia Preprocedure Evaluation (Addendum)
Anesthesia Evaluation  Patient identified by MRN, date of birth, ID band Patient awake    Reviewed: Allergy & Precautions, NPO status , Patient's Chart, lab work & pertinent test results  History of Anesthesia Complications (+) PONV and history of anesthetic complications  Airway Mallampati: III   Neck ROM: Full    Dental  (+) Upper Dentures   Pulmonary neg pulmonary ROS   Pulmonary exam normal breath sounds clear to auscultation       Cardiovascular hypertension, + CAD  Normal cardiovascular exam+ dysrhythmias (SVT s/p ablation)  Rhythm:Regular Rate:Normal     Neuro/Psych CVA (2017, no residual deficits)    GI/Hepatic negative GI ROS,,,  Endo/Other  diabetes, Type 2  Obesity   Renal/GU Renal disease (stage V CKD)     Musculoskeletal   Abdominal   Peds  Hematology  (+) Blood dyscrasia, anemia   Anesthesia Other Findings   Reproductive/Obstetrics                             Anesthesia Physical Anesthesia Plan  ASA: 4  Anesthesia Plan: MAC   Post-op Pain Management:    Induction: Intravenous  PONV Risk Score and Plan: 3 and Treatment may vary due to age or medical condition, Midazolam and TIVA  Airway Management Planned: Natural Airway and Nasal Cannula  Additional Equipment:   Intra-op Plan:   Post-operative Plan:   Informed Consent: I have reviewed the patients History and Physical, chart, labs and discussed the procedure including the risks, benefits and alternatives for the proposed anesthesia with the patient or authorized representative who has indicated his/her understanding and acceptance.     Dental advisory given  Plan Discussed with: CRNA  Anesthesia Plan Comments: (LMA/GETA backup discussed.  Patient consented for risks of anesthesia including but not limited to:  - adverse reactions to medications - damage to eyes, teeth, lips or other oral mucosa - nerve  damage due to positioning  - sore throat or hoarseness - damage to heart, brain, nerves, lungs, other parts of body or loss of life  Informed patient about role of CRNA in peri- and intra-operative care.  Patient voiced understanding.)        Anesthesia Quick Evaluation

## 2023-05-06 ENCOUNTER — Encounter: Payer: Self-pay | Admitting: Ophthalmology
